# Patient Record
Sex: Male | Born: 1950 | Race: White | Hispanic: No | Marital: Married | Smoking: Former smoker
Health system: Southern US, Community
[De-identification: ages and names within clinical notes are randomized; demographics above are authoritative.]

## PROBLEM LIST (undated history)

## (undated) DIAGNOSIS — E119 Type 2 diabetes mellitus without complications: Secondary | ICD-10-CM

## (undated) DIAGNOSIS — I219 Acute myocardial infarction, unspecified: Secondary | ICD-10-CM

## (undated) DIAGNOSIS — E785 Hyperlipidemia, unspecified: Secondary | ICD-10-CM

## (undated) DIAGNOSIS — I712 Thoracic aortic aneurysm, without rupture, unspecified: Secondary | ICD-10-CM

## (undated) DIAGNOSIS — I10 Essential (primary) hypertension: Secondary | ICD-10-CM

## (undated) DIAGNOSIS — N189 Chronic kidney disease, unspecified: Secondary | ICD-10-CM

## (undated) DIAGNOSIS — I2510 Atherosclerotic heart disease of native coronary artery without angina pectoris: Secondary | ICD-10-CM

## (undated) HISTORY — DX: Essential (primary) hypertension: I10

## (undated) HISTORY — DX: Thoracic aortic aneurysm, without rupture, unspecified: I71.20

## (undated) HISTORY — DX: Atherosclerotic heart disease of native coronary artery without angina pectoris: I25.10

## (undated) HISTORY — DX: Type 2 diabetes mellitus without complications: E11.9

## (undated) HISTORY — DX: Thoracic aortic aneurysm, without rupture: I71.2

## (undated) HISTORY — DX: Acute myocardial infarction, unspecified: I21.9

## (undated) HISTORY — DX: Chronic kidney disease, unspecified: N18.9

## (undated) HISTORY — DX: Hyperlipidemia, unspecified: E78.5

---

## 2015-02-07 LAB — PULMONARY FUNCTION TEST

## 2017-08-20 ENCOUNTER — Ambulatory Visit (INDEPENDENT_AMBULATORY_CARE_PROVIDER_SITE_OTHER)
Admission: RE | Admit: 2017-08-20 | Discharge: 2017-08-20 | Disposition: A | Discharge status: Home / Self Care | Payer: No Typology Code available for payment source | Source: Ambulatory Visit | Attending: Internal Medicine | Admitting: Internal Medicine

## 2017-08-20 ENCOUNTER — Other Ambulatory Visit (INDEPENDENT_AMBULATORY_CARE_PROVIDER_SITE_OTHER): Payer: No Typology Code available for payment source

## 2017-08-20 ENCOUNTER — Encounter: Payer: Self-pay | Admitting: Internal Medicine

## 2017-08-20 ENCOUNTER — Ambulatory Visit (INDEPENDENT_AMBULATORY_CARE_PROVIDER_SITE_OTHER): Payer: No Typology Code available for payment source | Admitting: Internal Medicine

## 2017-08-20 VITALS — BP 118/68 | HR 90 | Ht 72.0 in | Wt 268.0 lb

## 2017-08-20 DIAGNOSIS — R0609 Other forms of dyspnea: Secondary | ICD-10-CM

## 2017-08-20 DIAGNOSIS — J449 Chronic obstructive pulmonary disease, unspecified: Secondary | ICD-10-CM

## 2017-08-20 DIAGNOSIS — IMO0001 Reserved for inherently not codable concepts without codable children: Secondary | ICD-10-CM

## 2017-08-20 DIAGNOSIS — R911 Solitary pulmonary nodule: Secondary | ICD-10-CM

## 2017-08-20 LAB — BASIC METABOLIC PANEL
BUN: 18 mg/dL (ref 6–23)
CO2: 28 meq/L (ref 19–32)
CREATININE: 1.4 mg/dL (ref 0.40–1.50)
Calcium: 10.2 mg/dL (ref 8.4–10.5)
Chloride: 104 mEq/L (ref 96–112)
GFR: 53.46 mL/min — ABNORMAL LOW (ref >60.00)
GLUCOSE: 154 mg/dL — AB (ref 70–99)
Potassium: 4.3 mEq/L (ref 3.5–5.1)
Sodium: 138 mEq/L (ref 135–145)

## 2017-08-20 LAB — CBC WITH DIFFERENTIAL/PLATELET
Basophils Absolute: 0.1 10*3/uL (ref 0.0–0.1)
Basophils Relative: 1.4 % (ref 0.0–3.0)
EOS ABS: 0.4 10*3/uL (ref 0.0–0.7)
Eosinophils Relative: 4.6 % (ref 0.0–5.0)
HEMATOCRIT: 41.4 % (ref 39.0–52.0)
HEMOGLOBIN: 13.8 g/dL (ref 13.0–17.0)
LYMPHS PCT: 26.3 % (ref 12.0–46.0)
Lymphs Abs: 2.2 10*3/uL (ref 0.7–4.0)
MCHC: 33.4 g/dL (ref 30.0–36.0)
MCV: 87.6 fl (ref 78.0–100.0)
Monocytes Absolute: 0.6 10*3/uL (ref 0.1–1.0)
Monocytes Relative: 6.7 % (ref 3.0–12.0)
Neutro Abs: 5 10*3/uL (ref 1.4–7.7)
Neutrophils Relative %: 61 % (ref 43.0–77.0)
Platelets: 234 10*3/uL (ref 150.0–400.0)
RBC: 4.73 Mil/uL (ref 4.22–5.81)
RDW: 15.9 % — ABNORMAL HIGH (ref 11.5–15.5)
WBC: 8.3 10*3/uL (ref 4.0–10.5)

## 2017-08-20 LAB — BRAIN NATRIURETIC PEPTIDE: Pro B Natriuretic peptide (BNP): 6 pg/mL (ref 0.0–100.0)

## 2017-08-20 LAB — SEDIMENTATION RATE: Sed Rate: 7 mm/hr (ref 0–20)

## 2017-08-20 LAB — TSH: TSH: 1.48 u[IU]/mL (ref 0.35–4.50)

## 2017-08-20 NOTE — Patient Instructions (Signed)
Please remember to go to the lab and x-ray department downstairs in the basement  for your tests - we will call you with the results when they are available.     Please schedule a follow up office visit in 6 weeks, call sooner if needed with full pfts

## 2017-08-20 NOTE — Progress Notes (Signed)
Subjective:     Patient ID: Theodore Hall, male   DOB: 11-23-1950,     MRN: [id number]  HPI  59 yowm quit smoking 1984 and sob around 2015/16 dx as CHF by Theodore Hall in [place] with CT chest 05/11/17 showing bilateral GG "nodules" plus stable 5 mm SPN vs prev study 04/25/2016  5 mm RLL    08/20/2017 1st [facility] office visit/ Theodore Hall   Chief Complaint  Patient presents with  . Pulmonary Consult    Referred by Dr. Evalee Jefferson for eval of pulmonary nodule. Pt states that he has had trouble breathing over the past several years. He has hx of pleural effusions.  He states that he gets SOB if he stands for a long time or walks more than 50 ft.    indolent onset progressive Doe x MMRC3 = can't walk 100 yards even at a slow pace at a flat grade s stopping due to sob Assoc mild dry cough intermittent day > noct and assoc sense of pnds  Sob no better with inhalers in past  Wears cpap at hs and 02 just at hs /and recliner x 30 degrees x sev years      No obvious day to day or daytime variability or assoc excess/ purulent sputum or mucus plugs or hemoptysis or cp or chest tightness, subjective wheeze or overt sinus or hb symptoms. No unusual exposure hx or h/o childhood pna/ asthma or knowledge of premature birth.  Sleeping ok on cpap/ 02  without nocturnal  or early am exacerbation  of respiratory  c/o's or need for noct saba. Also denies any obvious fluctuation of symptoms with weather or environmental changes or other aggravating or alleviating factors except as outlined above   Current Allergies, Complete Past Medical History, Past Surgical History, Family History, and Social History were reviewed in [facility] record.  ROS  The following are not active complaints unless bolded Hoarseness, sore throat, dysphagia, dental problems, itching, sneezing,  nasal congestion or discharge of excess mucus or purulent secretions, ear ache,   fever, chills, sweats, unintended wt loss or wt gain,  classically pleuritic or exertional cp,  orthopnea pnd or leg swelling, presyncope, palpitations, abdominal pain, anorexia, nausea, vomiting, diarrhea  or change in bowel habits or change in bladder habits, change in stools or change in urine, dysuria, hematuria,  rash, arthralgias, visual complaints, headache, numbness, weakness or ataxia or problems with walking or coordination,  change in mood/affect or memory.        Current Meds  Medication Sig  . aspirin 325 MG tablet Take 325 mg by mouth daily.  Marland Kitchen atorvastatin (LIPITOR) 80 MG tablet Take 80 mg by mouth daily.  . carvedilol (COREG) 25 MG tablet Take 12.5 mg by mouth 2 (two) times daily with a meal.  . DULoxetine (CYMBALTA) 60 MG capsule Take 60 mg by mouth daily.  . empagliflozin (JARDIANCE) 25 MG TABS tablet Take 25 mg by mouth daily.  . furosemide (LASIX) 40 MG tablet Take 40 mg by mouth daily.  . hydrALAZINE (APRESOLINE) 50 MG tablet Take 50 mg by mouth 3 (three) times daily.  . isosorbide dinitrate (ISORDIL) 20 MG tablet Take 20 mg by mouth 3 (three) times daily.  Marland Kitchen losartan (COZAAR) 100 MG tablet Take 100 mg by mouth daily.  . metFORMIN (GLUCOPHAGE) 500 MG tablet Take 500 mg by mouth 2 (two) times daily with a meal.  . nystatin cream (MYCOSTATIN) Apply 1 application topically 2 (two) times daily.  Marland Kitchen  OXYGEN 2lpm with CPAP at night  . QUEtiapine (SEROQUEL) 300 MG tablet Take 300 mg by mouth at bedtime.  Marland Kitchen. spironolactone (ALDACTONE) 25 MG tablet Take 25 mg by mouth daily.  . traZODone (DESYREL) 50 MG tablet Take 50 mg by mouth at bedtime as needed for sleep.  Marland Kitchen. UNABLE TO FIND Med Name: CPAP with o2 2lpm      Review of Systems     Objective:   Physical Exam    amb obese wm using rollator   Wt Readings from Last 3 Encounters:  08/20/17 268 lb (121.6 kg)     Vital signs reviewed - Note on arrival 02 sats  94% on RA     HEENT: nl dentition, turbinates bilaterally, and oropharynx. Nl external ear canals without cough  reflex   NECK :  without JVD/Nodes/TM/ nl carotid upstrokes bilaterally   LUNGS: no acc muscle use,  Nl contour chest which is clear to A and P bilaterally without cough on insp or exp maneuvers   CV:  RRR  no s3 or murmur or increase in P2, and no edema   ABD:  Quite obese but soft and nontender with limited inspiratory excursion in the supine position. No bruits or organomegaly appreciated, bowel sounds nl  MS:  Nl gait/ ext warm without deformities, calf tenderness, cyanosis or clubbing No obvious joint restrictions   SKIN: warm and dry without lesions    NEURO:  alert, approp, nl sensorium with  no motor or cerebellar deficits apparent.      CXR PA and Lateral:   08/20/2017 :    I personally reviewed images and agree with radiology impression as follows:    1. No pneumonia or effusion. 2. Slight hyper aeration.  Borderline cardiomegaly.   Labs ordered/ reviewed:      Chemistry      Component Value Date/Time   NA 138 08/20/2017 1533   K 4.3 08/20/2017 1533   CL 104 08/20/2017 1533   CO2 28 08/20/2017 1533   BUN 18 08/20/2017 1533   CREATININE 1.40 08/20/2017 1533      Component Value Date/Time   CALCIUM 10.2 08/20/2017 1533        Lab Results  Component Value Date   WBC 8.3 08/20/2017   HGB 13.8 08/20/2017   HCT 41.4 08/20/2017   MCV 87.6 08/20/2017   PLT 234.0 08/20/2017       EOS                                                               0.4                                    08/20/2017      Lab Results  Component Value Date   TSH 1.48 08/20/2017     Lab Results  Component Value Date   PROBNP 6.0 08/20/2017       Lab Results  Component Value Date   ESRSEDRATE 7 08/20/2017            Assessment:

## 2017-08-21 ENCOUNTER — Encounter: Payer: Self-pay | Admitting: Internal Medicine

## 2017-08-21 DIAGNOSIS — IMO0001 Reserved for inherently not codable concepts without codable children: Secondary | ICD-10-CM | POA: Insufficient documentation

## 2017-08-21 DIAGNOSIS — R911 Solitary pulmonary nodule: Secondary | ICD-10-CM | POA: Insufficient documentation

## 2017-08-21 NOTE — Progress Notes (Signed)
Attempted to call patient but number rang busy. No message left will try again tomorrow

## 2017-08-21 NOTE — Assessment & Plan Note (Signed)
Spirometry  01/2015  FEV1 1.95 (62%)  Ratio 71 p 2 % improvement p saba   - Spirometry 08/20/2017  FEV1 2.45 (68%)  Ratio 78  s curvature     When respiratory symptoms begin or become refractory well after a patient reports complete smoking cessation,  Especially when this wasn't the case while they were smoking, a red flag is raised based on the work of Dr Primitivo GauzeFletcher which states:  if you quit smoking when your best day FEV1 is still well preserved it is highly unlikely you will progress to severe disease.  That is to say, once the smoking stops,  the symptoms should not suddenly erupt or markedly worsen.  If so, the differential diagnosis should include  obesity/deconditioning,  LPR/Reflux/Aspiration syndromes,  occult CHF, or  especially side effect of medications commonly used in this population.   bnp is very low so most likely this is obesity/ deconditioning  rec return for full pfts to complete the w/u    Total time devoted to counseling  > 50 % of initial 60 min office visit:  review case with pt/ discussion of options/alternatives/ personally creating written customized instructions  in presence of pt  then going over those specific  Instructions directly with the pt including how to use all of the meds but in particular covering each new medication in detail and the difference between the maintenance= "automatic" meds and the prns using an action plan format for the latter (If this problem/symptom => do that organization reading Left to right).  Please see AVS from this visit for a full list of these instructions which I personally wrote for this pt and  are unique to this visit.

## 2017-08-21 NOTE — Assessment & Plan Note (Addendum)
08/20/2017   Walked RA x one lap @ 185 stopped due to  Sob/ nl pace, no desat    Symptoms are   disproportionate to objective findings and not clear to what extent this is actually a pulmonary  problem but pt does appear to have difficult to sort out respiratory symptoms of unknown origin for which  DDX  = almost all start with A and  include Adherence, Ace Inhibitors, Acid Reflux, Active Sinus Disease, Alpha 1 Antitripsin deficiency, Anxiety masquerading as Airways dz,  ABPA,  Allergy(esp in young), Aspiration (esp in elderly), Adverse effects of meds,  Active smokers, A bunch of PE's/clot burden (a few small clots can't cause this syndrome unless there is already severe underlying pulm or vascular dz with poor reserve),  Anemia or thyroid disorder, plus two Bs  = Bronchiectasis and Beta blocker use..and one C= CHF    Adherence is always the initial "prime suspect" and is a multilayered concern that requires a "trust but verify" approach in every patient - starting with knowing how to use medications, especially inhalers, correctly, keeping up with refills and understanding the fundamental difference between maintenance and prns vs those medications only taken for a very short course and then stopped and not refilled.  - advised to return with all meds in hand using a trust but verify approach to confirm accurate Medication  Reconciliation The principal here is that until we are certain that the  patients are doing what we've asked, it makes no sense to ask them to do more.   ? Acid (or non-acid) GERD > always difficult to exclude as up to 75% of pts in some series report no assoc GI/ Heartburn symptoms> rec continue max (24h)  acid suppression and diet restrictions/ reviewed     ? Allergy /asthma > no clinical evidence   ? Anxiety /depression/ deconditioning> usually at the bottom of this list of usual suspects but should be   higher on this pt's based on H and P and note already on psychotropics .    ? Bb Effects >  In the setting of respiratory symptoms of unknown etiology,  It would be preferable to use bystolic, the most beta -1  selective Beta blocker available in sample form, with bisoprolol the most selective generic choice  on the market, at least on a trial basis, to make sure the spillover Beta 2 effects of the less specific Beta blockers are not contributing to this patient's symptoms.   ? chf > ruled out with bnp only 6 today

## 2017-08-21 NOTE — Progress Notes (Signed)
Attempted to call patient phone rang busy, will try again tomrorow

## 2017-08-21 NOTE — Assessment & Plan Note (Signed)
CT chest 05/05/16 vs 05/11/17  Stable 5 mm RLL and new GG changes both lower lobes - Cxr 08/20/2017 wnl   Although there are clearly abnormalities on CT scan, they should probably be considered "microscopic" since not obvious on plain cxr .     In the setting of obvious "macroscopic" health issues,  I am very reluctatnt to embark on an invasive w/u at this point but will arrange consevative  follow up and in the meantime see what we can do to address the patient's subjective concerns.     Most likely the GG changes are benign and either related to infection or chf and f/u can be done thru [facility] but really optional here.

## 2017-08-24 NOTE — Progress Notes (Signed)
Unable to reach the pt at his preferred number

## 2017-08-24 NOTE — Progress Notes (Signed)
Unable to reach pt at his preferred number

## 2017-08-26 ENCOUNTER — Encounter: Payer: No Typology Code available for payment source | Admitting: Cardiothoracic Surgery

## 2017-09-08 ENCOUNTER — Other Ambulatory Visit: Payer: Self-pay

## 2017-09-08 ENCOUNTER — Institutional Professional Consult (permissible substitution) (INDEPENDENT_AMBULATORY_CARE_PROVIDER_SITE_OTHER): Payer: No Typology Code available for payment source | Admitting: Cardiothoracic Surgery

## 2017-09-08 ENCOUNTER — Encounter: Payer: Self-pay | Admitting: Cardiothoracic Surgery

## 2017-09-08 VITALS — BP 121/68 | HR 80 | Resp 18 | Ht 72.0 in | Wt 266.4 lb

## 2017-09-08 DIAGNOSIS — I712 Thoracic aortic aneurysm, without rupture, unspecified: Secondary | ICD-10-CM

## 2017-09-08 NOTE — Progress Notes (Signed)
[facility]P [facility] Referring Provider is Ninfa Meeker, MD  Chief Complaint  Patient presents with  . 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  From records available it was first noted in 2017 at which time the diameter was 4.6 cm.  There is no family history of aortic dissection or abdominal aortic aneurysm disease.  Patient has hypertension and is compliant with his medications including losartan and carvedilol.  Patient had echocardiogram in 2018 which showed EF of 55% with a trileaflet aortic valve without aortic insufficiency.  The patient had a cardiac cath within the past 4 years in [place] and had calcified three-vessel disease and apparently he was not felt to be candidate for interventional therapy and has been treated medically.  He denies much chest pain and takes nitroglycerin very infrequently for pain radiating down his left arm.  Past Medical History:  Diagnosis Date  . CAD (coronary artery disease)   . CKD (chronic kidney disease)   . DM (diabetes mellitus) (HCC)   . HTN (hypertension)   . Hyperlipidemia   . MI (myocardial infarction) (HCC)   . Thoracic aortic aneurysm without rupture (HCC)     History reviewed. No pertinent surgical history.  History reviewed. No pertinent family history.  Social History Social History   Tobacco Use  . Smoking status: Former Smoker    Packs/day: 4.00    Years: 17.00    Pack years: 68.00    Types: Cigarettes    Last attempt to quit: 05/26/1982    Years since quitting: 35.3  . Smokeless tobacco: Never Used  Substance Use Topics  . Alcohol use: Not  on file  . Drug use: Not on file    Current Outpatient Medications  Medication Sig Dispense Refill  . aspirin 325 MG tablet Take 325 mg by mouth daily.    Marland Kitchen atorvastatin (LIPITOR) 80 MG tablet Take 80 mg by mouth daily.    . carboxymethylcellulose (REFRESH PLUS) 0.5 % SOLN 1 drop 3 (three) times daily as needed.    . carvedilol (COREG) 25 MG tablet Take 12.5 mg by mouth 2 (two) times daily with a meal.    . cyproheptadine (PERIACTIN) 4 MG tablet Take 4 mg by mouth 3 (three) times daily as needed for allergies.    . DULoxetine (CYMBALTA) 60 MG capsule Take 60 mg by mouth daily.    . empagliflozin (JARDIANCE) 25 MG TABS tablet Take 25 mg by mouth daily.    . furosemide (LASIX) 40 MG tablet Take 40 mg by mouth daily.    Marland Kitchen glipiZIDE (GLUCOTROL) 5 MG tablet Take 5 mg by mouth daily before breakfast.    . hydrALAZINE (APRESOLINE) 50 MG tablet Take 50 mg by mouth 3 (three) times daily.    . isosorbide dinitrate (ISORDIL) 20 MG tablet Take 20 mg by mouth 3 (three) times daily.    Marland Kitchen losartan (COZAAR) 100 MG tablet Take 100 mg by mouth daily.    Marland Kitchen MAGNESIUM PO Take 1 tablet by mouth daily.    . metFORMIN (GLUCOPHAGE) 500 MG tablet Take 500 mg by mouth 2 (two) times daily with a meal.    .  nitroGLYCERIN (NITROSTAT) 0.4 MG SL tablet Place 0.4 mg under the tongue every 5 (five) minutes as needed for chest pain.    Marland Kitchen nystatin cream (MYCOSTATIN) Apply 1 application topically 2 (two) times daily.    Marland Kitchen oxybutynin (DITROPAN-XL) 10 MG 24 hr tablet Take 10 mg by mouth 2 (two) times daily.    . OXYGEN 2lpm with CPAP at night    . potassium chloride (KLOR-CON) 20 MEQ packet Take 40 mEq by mouth 3 (three) times daily.    . QUEtiapine (SEROQUEL) 300 MG tablet Take 300 mg by mouth at bedtime.    Marland Kitchen spironolactone (ALDACTONE) 25 MG tablet Take 25 mg by mouth daily.    Marland Kitchen terazosin (HYTRIN) 5 MG capsule Take 5 mg by mouth 3 (three) times daily.    . traZODone (DESYREL) 50 MG tablet Take 50 mg by mouth at bedtime as  needed for sleep.    Marland Kitchen UNABLE TO FIND Med Name: CPAP with o2 2lpm     No current facility-administered medications for this visit.     Allergies  Allergen Reactions  . Dilaudid [Hydromorphone Hcl] Other (See Comments)    Agitation and hallucinations  . Lisinopril Other (See Comments)    Unknown, ask patient  . Morphine Rash    Causes hallucination and agitation      Review of Systems  Weight stable No syncope or falls Lives with family Monitors his diabetes No history of thoracic trauma No symptoms of heart failure including orthopnea PND or edema No difficulty swallowing or dental complaints No abdominal pain jaundice or blood per rectum  BP 121/68 (BP Location: Right Arm, Patient Position: Sitting, Cuff Size: Large)   Pulse 80   Resp 18   Ht 6' (1.829 m)   Wt 266 lb 6.4 oz (120.8 kg)   SpO2 97% Comment: RA  BMI 36.13 kg/m  Physical Exam       Exam    General- alert and comfortable, obese in no distress    Neck- no JVD, no cervical adenopathy palpable, no carotid bruit   Lungs- clear without rales, wheezes.  No chest deformity   Cor- regular rate and rhythm, no murmur , gallop   Abdomen- soft, non-tender, obese without pulsatile mass   Extremities - warm, non-tender, minimal edema   Neuro- oriented, appropriate, no focal weakness   Diagnostic Tests: CTA performed in March 2019 no pleural effusion.  Shows a smooth fusiform ascending aneurysm measuring 4.6 cm diameter.  There is mild calcification of the distal arch.  The descending thoracic aorta is not significantly enlarged.  It does not appear the thoracic aneurysm has changed since 2017.  Impression: Patient has a stable asymptomatic fusiform ascending aneurysm. It remains 4.6 cm in diameter for at least 2 years and probably longer Thoracic aneurysms are not recommended for surgical repair until the diameter reaches 5.5 cm at which point the risk of aortic dissection becomes significant and approaches the  risk of surgery.  The patient's best therapy at this point his blood pressure control and surveillance scans of his aorta with annual CT scans.  I will see the patient back in December 2019 with a CTA to assess the thoracic aortic fusiform aneurysm, one year since his previous scan.  He understands importance of being compliant with his blood pressure medication, he should not lift heavy weights or 50 pounds, and he should avoid Cipro floxacillin and fluoroquinolone antibiotics which can weaken the wall of the aorta.  Plan: Return December 2019  with CTA of thoracic aorta.  Continue current blood pressure medications and follow precautions as noted above   Mikey BussingPeter Van Trigt III, MD [facility] [phone]

## 2017-09-21 ENCOUNTER — Encounter: Payer: Self-pay | Admitting: *Deleted

## 2017-10-01 ENCOUNTER — Ambulatory Visit (INDEPENDENT_AMBULATORY_CARE_PROVIDER_SITE_OTHER): Payer: No Typology Code available for payment source | Admitting: Internal Medicine

## 2017-10-01 ENCOUNTER — Encounter: Payer: Self-pay | Admitting: Internal Medicine

## 2017-10-01 ENCOUNTER — Other Ambulatory Visit (INDEPENDENT_AMBULATORY_CARE_PROVIDER_SITE_OTHER): Payer: No Typology Code available for payment source

## 2017-10-01 VITALS — BP 104/64 | HR 86 | Ht 72.0 in | Wt 266.0 lb

## 2017-10-01 DIAGNOSIS — R053 Chronic cough: Secondary | ICD-10-CM

## 2017-10-01 DIAGNOSIS — IMO0001 Reserved for inherently not codable concepts without codable children: Secondary | ICD-10-CM

## 2017-10-01 DIAGNOSIS — R0609 Other forms of dyspnea: Secondary | ICD-10-CM | POA: Diagnosis not present

## 2017-10-01 DIAGNOSIS — J449 Chronic obstructive pulmonary disease, unspecified: Secondary | ICD-10-CM

## 2017-10-01 DIAGNOSIS — R911 Solitary pulmonary nodule: Secondary | ICD-10-CM | POA: Diagnosis not present

## 2017-10-01 DIAGNOSIS — R05 Cough: Secondary | ICD-10-CM

## 2017-10-01 LAB — PULMONARY FUNCTION TEST
DL/VA % pred: 73 %
DL/VA: 3.48 ml/min/mmHg/L
DLCO COR % PRED: 59 %
DLCO COR: 20.98 ml/min/mmHg
DLCO unc % pred: 58 %
DLCO unc: 20.49 ml/min/mmHg
FEF 25-75 Post: 2.11 L/sec
FEF 25-75 Pre: 2.55 L/sec
FEF2575-%Change-Post: -17 %
FEF2575-%PRED-PRE: 93 %
FEF2575-%Pred-Post: 77 %
FEV1-%CHANGE-POST: -3 %
FEV1-%PRED-POST: 80 %
FEV1-%Pred-Pre: 83 %
FEV1-Post: 2.87 L
FEV1-Pre: 2.98 L
FEV1FVC-%Change-Post: 0 %
FEV1FVC-%Pred-Pre: 104 %
FEV6-%Change-Post: -4 %
FEV6-%Pred-Post: 80 %
FEV6-%Pred-Pre: 84 %
FEV6-PRE: 3.85 L
FEV6-Post: 3.69 L
FEV6FVC-%CHANGE-POST: -1 %
FEV6FVC-%PRED-POST: 103 %
FEV6FVC-%Pred-Pre: 104 %
FVC-%Change-Post: -3 %
FVC-%PRED-POST: 78 %
FVC-%Pred-Pre: 80 %
FVC-Post: 3.76 L
FVC-Pre: 3.88 L
POST FEV6/FVC RATIO: 98 %
Post FEV1/FVC ratio: 76 %
Pre FEV1/FVC ratio: 77 %
Pre FEV6/FVC Ratio: 100 %

## 2017-10-01 LAB — CBC WITH DIFFERENTIAL/PLATELET
BASOS ABS: 0.1 10*3/uL (ref 0.0–0.1)
Basophils Relative: 1.1 % (ref 0.0–3.0)
Eosinophils Absolute: 0.6 10*3/uL (ref 0.0–0.7)
Eosinophils Relative: 6.6 % — ABNORMAL HIGH (ref 0.0–5.0)
HEMATOCRIT: 41.6 % (ref 39.0–52.0)
Hemoglobin: 13.8 g/dL (ref 13.0–17.0)
LYMPHS PCT: 22.8 % (ref 12.0–46.0)
Lymphs Abs: 2 10*3/uL (ref 0.7–4.0)
MCHC: 33.3 g/dL (ref 30.0–36.0)
MCV: 88.6 fl (ref 78.0–100.0)
MONOS PCT: 7.9 % (ref 3.0–12.0)
Monocytes Absolute: 0.7 10*3/uL (ref 0.1–1.0)
NEUTROS ABS: 5.3 10*3/uL (ref 1.4–7.7)
Neutrophils Relative %: 61.6 % (ref 43.0–77.0)
Platelets: 225 10*3/uL (ref 150.0–400.0)
RBC: 4.7 Mil/uL (ref 4.22–5.81)
RDW: 16.1 % — AB (ref 11.5–15.5)
WBC: 8.6 10*3/uL (ref 4.0–10.5)

## 2017-10-01 NOTE — Addendum Note (Signed)
Addended by: Jaynee Eagles C on: 10/01/2017 11:18 AM   Modules accepted: Orders

## 2017-10-01 NOTE — Progress Notes (Signed)
Subjective:     Patient ID: Theodore Hall, male   DOB: [birth year],     MRN: [id number]    Brief patient profile:  50 yowm quit smoking 1984 and sob around 2015/16 dx as CHF by [place] with CT chest 05/11/17 showing bilateral GG "nodules" plus stable 5 mm SPN vs prev study 04/25/2016  5 mm RLL    History of Present Illness  08/20/2017 1st [facility] office visit/ [facility]   Chief Complaint  Patient presents with  . Pulmonary Consult    Referred by Dr. Evalee Jefferson for eval of pulmonary nodule. Pt states that he has had trouble breathing over the past several years. He has hx of pleural effusions.  He states that he gets SOB if he stands for a long time or walks more than 50 ft.    indolent onset progressive Doe x MMRC3 = can't walk 100 yards even at a slow pace at a flat grade s stopping due to sob Assoc mild dry cough intermittent day > noct and assoc sense of pnds  Sob no better with inhalers in past  Wears cpap at hs and 02 just at hs /and recliner x 30 degrees x sev years    rec No change rx     10/01/2017  f/u ov/[facility] re: obeisty / cough  Chief Complaint  Patient presents with  . COPD   Dyspnea:  MMRC3 = can't walk 100 yards even at a slow pace at a flat grade s stopping due to sob and knees give out about the same time  Cough: dry cough worse with pollen Sleep: cpap/ 02 ok  At 30 degrees hob  SABA use:  None   No obvious day to day or daytime variability or assoc excess/ purulent sputum or mucus plugs or hemoptysis or cp or chest tightness, subjective wheeze or overt sinus or hb symptoms. No unusual exposure hx or h/o childhood pna/ asthma or knowledge of premature birth.  Sleeping  Cpap/ 02 ok at 30 degrees hob  without nocturnal  or early am exacerbation  of respiratory  c/o's or need for noct saba. Also denies any obvious fluctuation of symptoms with weather or environmental changes or other aggravating or alleviating factors except as outlined above   Current Allergies,  Complete Past Medical History, Past Surgical History, Family History, and Social History were reviewed in [facility] record.  ROS  The following are not active complaints unless bolded Hoarseness, sore throat, dysphagia, dental problems, itching, sneezing,  nasal congestion or discharge of excess mucus or purulent secretions, ear ache,   fever, chills, sweats, unintended wt loss or wt gain, classically pleuritic or exertional cp,  orthopnea pnd or arm/hand swelling  or leg swelling, presyncope, palpitations, abdominal pain, anorexia, nausea, vomiting, diarrhea  or change in bowel habits or change in bladder habits, change in stools or change in urine, dysuria, hematuria,  rash, arthralgias, visual complaints, headache, numbness, weakness or ataxia or problems with walking or coordination,  change in mood or  memory.        Current Meds  Medication Sig  . aspirin 325 MG tablet Take 325 mg by mouth daily.  Marland Kitchen atorvastatin (LIPITOR) 80 MG tablet Take 80 mg by mouth daily.  . carboxymethylcellulose (REFRESH PLUS) 0.5 % SOLN 1 drop 3 (three) times daily as needed.  . carvedilol (COREG) 25 MG tablet Take 12.5 mg by mouth 2 (two) times daily with a meal.  . cyproheptadine (PERIACTIN) 4 MG  tablet Take 4 mg by mouth 3 (three) times daily as needed (for nightmares).  . DULoxetine (CYMBALTA) 60 MG capsule Take 60 mg by mouth daily.  . furosemide (LASIX) 40 MG tablet Take 40 mg by mouth daily.  Marland Kitchen glipiZIDE (GLUCOTROL) 5 MG tablet Take 5 mg by mouth daily before breakfast.  . hydrALAZINE (APRESOLINE) 50 MG tablet Take 50 mg by mouth 3 (three) times daily.  . isosorbide dinitrate (ISORDIL) 20 MG tablet Take 20 mg by mouth 3 (three) times daily.  Marland Kitchen losartan (COZAAR) 100 MG tablet Take 100 mg by mouth daily.  Marland Kitchen MAGNESIUM PO Take 1 tablet by mouth daily.  . metFORMIN (GLUCOPHAGE) 500 MG tablet Take 500 mg by mouth 2 (two) times daily with a meal.  . nitroGLYCERIN (NITROSTAT) 0.4 MG SL  tablet Place 0.4 mg under the tongue every 5 (five) minutes as needed for chest pain.  Marland Kitchen nystatin cream (MYCOSTATIN) Apply 1 application topically 2 (two) times daily.  Marland Kitchen oxybutynin (DITROPAN-XL) 10 MG 24 hr tablet Take 10 mg by mouth 2 (two) times daily.  . OXYGEN 2lpm with CPAP at night  . potassium chloride (KLOR-CON) 20 MEQ packet Take 40 mEq by mouth 3 (three) times daily.  . QUEtiapine (SEROQUEL) 300 MG tablet Take 300 mg by mouth at bedtime.  Marland Kitchen spironolactone (ALDACTONE) 25 MG tablet Take 25 mg by mouth daily.  Marland Kitchen terazosin (HYTRIN) 5 MG capsule Take 5 mg by mouth 3 (three) times daily.  . traZODone (DESYREL) 50 MG tablet Take 50 mg by mouth at bedtime as needed for sleep.  Marland Kitchen UNABLE TO FIND Med Name: CPAP with o2 2lpm               Objective:   Physical Exam  amb obese wm using cane     Wt Readings from Last 3 Encounters:  10/01/17 266 lb (120.7 kg)  09/08/17 266 lb 6.4 oz (120.8 kg)  08/20/17 268 lb (121.6 kg)     Vital signs reviewed - Note on arrival 02 sats  97% on RA       HEENT: nl dentition, turbinates bilaterally, and oropharynx. Nl external ear canals without cough reflex   NECK :  without JVD/Nodes/TM/ nl carotid upstrokes bilaterally   LUNGS: no acc muscle use,  Nl contour chest which is clear to A and P bilaterally without cough on insp or exp maneuvers   CV:  RRR  no s3 or murmur or increase in P2, and no edema   ABD:  Quite obese/ soft with limited inspiratory excursion in the supine position. No bruits or organomegaly appreciated, bowel sounds nl  MS:  Nl gait/ ext warm without deformities, calf tenderness, cyanosis or clubbing No obvious joint restrictions   SKIN: warm and dry without lesions    NEURO:  alert, approp, nl sensorium with  no motor or cerebellar deficits apparent.         Labs ordered 10/01/2017   Allergy profile       Assessment:

## 2017-10-01 NOTE — Progress Notes (Signed)
PFT completed today.  

## 2017-10-01 NOTE — Patient Instructions (Addendum)
Your problem with your breathing is your weight and your joints   I will call you in 3 months to make sure you've [facility]  Please remember to go to the lab department downstairs in the basement  for your tests - we will call you with the results when they are available.  Pulmonary follow up is as needed

## 2017-10-02 ENCOUNTER — Encounter: Payer: Self-pay | Admitting: Internal Medicine

## 2017-10-02 DIAGNOSIS — R05 Cough: Secondary | ICD-10-CM | POA: Insufficient documentation

## 2017-10-02 DIAGNOSIS — R053 Chronic cough: Secondary | ICD-10-CM | POA: Insufficient documentation

## 2017-10-02 LAB — RESPIRATORY ALLERGY PROFILE REGION II ~~LOC~~
Allergen, A. alternata, m6: <0.1 kU/L
Allergen, Cedar tree, t12: <0.1 kU/L
Allergen, Comm Silver Birch, t9: <0.1 kU/L
Allergen, Cottonwood, t14: <0.1 kU/L
Allergen, Mulberry, t76: <0.1 kU/L
Aspergillus fumigatus, m3: <0.1 kU/L
Bermuda Grass: <0.1 kU/L
Box Elder IgE: <0.1 kU/L
CLADOSPORIUM HERBARUM (M2) IGE: <0.1 kU/L
CLASS: 0
CLASS: 0
CLASS: 0
CLASS: 0
CLASS: 0
CLASS: 0
CLASS: 0
CLASS: 0
CLASS: 0
CLASS: 0
CLASS: 0
COMMON RAGWEED (SHORT) (W1) IGE: <0.1 kU/L
Cat Dander: <0.1 kU/L
Class: 0
Class: 0
Class: 0
Class: 0
Class: 0
Class: 0
Class: 0
Class: 0
Class: 0
Class: 0
Class: 0
Class: 0
Class: 0
D. farinae: <0.1 kU/L
Dog Dander: <0.1 kU/L
IgE (Immunoglobulin E), Serum: 22 kU/L (ref <=114)
Pecan/Hickory Tree IgE: <0.1 kU/L
Rough Pigweed  IgE: <0.1 kU/L
Sheep Sorrel IgE: <0.1 kU/L

## 2017-10-02 LAB — INTERPRETATION:

## 2017-10-02 NOTE — Assessment & Plan Note (Signed)
-   Allergy profile 10/01/2017 >  Eos 0.6 /  IgE  Pending   May have seasonal rhinitis > rec try otc's first and f/u here prn

## 2017-10-02 NOTE — Assessment & Plan Note (Signed)
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

## 2017-10-02 NOTE — Assessment & Plan Note (Signed)
Quit smoking in 1994 Spirometry  01/2015  FEV1 1.95 (62%)  Ratio 71 p 2 % improvement p saba   - Spirometry 08/20/2017  FEV1 2.45 (68%)  Ratio 78  s curvature  - PFT's  10/01/2017  FEV1 2.98 (83 % ) ratio 77  p no % improvement from saba p nothing prior to study with DLCO  58/59c  % corrects to 73 % for alv volume  /very mild curvature     When respiratory symptoms begin or become refractory well after a patient reports complete smoking cessation,  Especially when this wasn't the case while they were smoking, a red flag is raised based on the work of Dr Primitivo Gauze which states:  if you quit smoking when your best day FEV1 is still well preserved it is highly unlikely you will progress to severe disease.  That is to say, once the smoking stops,  the symptoms should not suddenly erupt or markedly worsen.  If so, the differential diagnosis should include  obesity/deconditioning,  LPR/Reflux/Aspiration syndromes,  occult CHF, or  especially side effect of medications commonly used in this population (esp acei and non-specific Beta blockers in pts like this)   He has no significant copd at this point nor active asthma though is at risk > no resp rx needed for now and  f/u can be prn  I had an extended discussion with the patient reviewing all relevant studies completed to date and  lasting 15 to 20 minutes of a 25 minute final summary office visit    Each maintenance medication was reviewed in detail including most importantly the difference between maintenance and prns and under what circumstances the prns are to be triggered using an action plan format that is not reflected in the computer generated alphabetically organized AVS.    Please see AVS for specific instructions unique to this visit that I personally wrote and verbalized to the the pt in detail and then reviewed with pt  by my nurse highlighting any  changes in therapy recommended at today's visit to their plan of care.

## 2017-10-02 NOTE — Assessment & Plan Note (Signed)
No evidence of copd/ strongly suspect obesity and deconditioning plus component of chf > [facility]

## 2017-10-05 NOTE — Progress Notes (Signed)
ATC, Line busy

## 2018-01-05 ENCOUNTER — Encounter: Payer: Self-pay | Admitting: *Deleted

## 2018-01-05 ENCOUNTER — Telehealth: Payer: Self-pay | Admitting: *Deleted

## 2018-01-05 NOTE — Telephone Encounter (Signed)
Unable to reach by telephone so mailed letter

## 2018-01-05 NOTE — Telephone Encounter (Signed)
-----   Message from Nyoka CowdenMichael B Wert, MD sent at 10/01/2017 11:58 AM EDT ----- [facility] now

## 2018-04-05 ENCOUNTER — Other Ambulatory Visit: Payer: Self-pay | Admitting: Cardiothoracic Surgery

## 2018-04-05 DIAGNOSIS — I712 Thoracic aortic aneurysm, without rupture, unspecified: Secondary | ICD-10-CM

## 2018-05-12 ENCOUNTER — Ambulatory Visit: Payer: Self-pay | Admitting: Cardiothoracic Surgery

## 2018-05-12 ENCOUNTER — Other Ambulatory Visit: Payer: Self-pay

## 2018-06-23 ENCOUNTER — Other Ambulatory Visit: Payer: Self-pay

## 2018-06-23 ENCOUNTER — Ambulatory Visit: Payer: Self-pay | Admitting: Cardiothoracic Surgery

## 2018-07-21 ENCOUNTER — Ambulatory Visit (INDEPENDENT_AMBULATORY_CARE_PROVIDER_SITE_OTHER): Payer: No Typology Code available for payment source | Admitting: Cardiothoracic Surgery

## 2018-07-21 ENCOUNTER — Ambulatory Visit
Admission: RE | Admit: 2018-07-21 | Discharge: 2018-07-21 | Disposition: A | Discharge status: Home / Self Care | Payer: No Typology Code available for payment source | Source: Ambulatory Visit | Attending: Cardiothoracic Surgery | Admitting: Cardiothoracic Surgery

## 2018-07-21 ENCOUNTER — Encounter: Payer: Self-pay | Admitting: Cardiothoracic Surgery

## 2018-07-21 ENCOUNTER — Other Ambulatory Visit: Payer: Self-pay

## 2018-07-21 VITALS — BP 116/68 | HR 85 | Resp 18 | Ht 72.0 in | Wt 261.0 lb

## 2018-07-21 DIAGNOSIS — I712 Thoracic aortic aneurysm, without rupture, unspecified: Secondary | ICD-10-CM

## 2018-07-21 MED ORDER — IOPAMIDOL (ISOVUE-370) INJECTION 76%
75.0000 mL | Freq: Once | INTRAVENOUS | Status: AC | PRN
  Administered 2018-07-21: 75 mL via INTRAVENOUS

## 2018-07-21 NOTE — Progress Notes (Signed)
[facility] Referring Provider is Ninfa Meeker, MD  Chief Complaint  Patient presents with  . TAA    8 month f/u eiyh CTA CHEST today  Patient examined, images of CTA personally reviewed and discussed with patient and daughter.  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  This was first noted in 2017 at which time it was 4.6 cm.  The patient has a past history of smoking.  He has hypertension but is compliant with his medications including carvedilol, losartan, a Apresoline.  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  Patient continues to be asymptomatic without significant chest pain.  His only hospitalizations since his last visit was for a urologic procedure at the [facility] in for altered mental status due to his psychotropic medication which was adjusted [Seroquel].  Review of his medical record shows his blood pressure to be well controlled on his current medications. Past Medical History:  Diagnosis Date  . CAD (coronary artery disease)   . CKD (chronic kidney disease)   . DM (diabetes mellitus) (HCC)   . HTN (hypertension)   . Hyperlipidemia   . MI (myocardial infarction) (HCC)   . Thoracic aortic aneurysm without rupture (HCC)     History reviewed. No pertinent surgical history.  History reviewed. No pertinent family history.  Social History Social History   Tobacco Use  . Smoking status: Former Smoker    Packs/day: 4.00    Years: 17.00    Pack years: 68.00    Types: Cigarettes    Last attempt to quit: 05/26/1982    Years since quitting: 36.1  . Smokeless tobacco: Never Used  Substance Use Topics  . Alcohol use: Not on file  . Drug use: Not on file    Current Outpatient Medications  Medication Sig Dispense Refill  . aspirin 325 MG tablet Take 325 mg by mouth daily.    Marland Kitchen atorvastatin (LIPITOR) 80 MG tablet Take 80 mg by mouth daily.    . carboxymethylcellulose (REFRESH  PLUS) 0.5 % SOLN 1 drop 3 (three) times daily as needed.    . carvedilol (COREG) 25 MG tablet Take 12.5 mg by mouth 2 (two) times daily with a meal.    . cyproheptadine (PERIACTIN) 4 MG tablet Take 4 mg by mouth 3 (three) times daily as needed (for nightmares).    . DULoxetine (CYMBALTA) 60 MG capsule Take 60 mg by mouth daily.    . furosemide (LASIX) 40 MG tablet Take 40 mg by mouth daily.    Marland Kitchen glipiZIDE (GLUCOTROL) 5 MG tablet Take 5 mg by mouth daily before breakfast.    . hydrALAZINE (APRESOLINE) 50 MG tablet Take 50 mg by mouth 3 (three) times daily.    . isosorbide dinitrate (ISORDIL) 20 MG tablet Take 20 mg by mouth 3 (three) times daily.    Marland Kitchen losartan (COZAAR) 100 MG tablet Take 100 mg by mouth daily.    Marland Kitchen MAGNESIUM PO Take 1 tablet by mouth daily.    . metFORMIN (GLUCOPHAGE) 500 MG tablet Take 500 mg by mouth 2 (two) times daily with a meal.    . nitroGLYCERIN (NITROSTAT) 0.4 MG SL tablet Place 0.4 mg under the tongue every 5 (five) minutes as needed for chest pain.    Marland Kitchen nystatin cream (MYCOSTATIN) Apply 1 application topically 2 (two) times daily.    Marland Kitchen oxybutynin (DITROPAN-XL) 10 MG 24 hr tablet Take 10 mg by mouth 2 (two) times  daily.    . OXYGEN 2lpm with CPAP at night    . potassium chloride (KLOR-CON) 20 MEQ packet Take 40 mEq by mouth 3 (three) times daily.    Marland Kitchen spironolactone (ALDACTONE) 25 MG tablet Take 25 mg by mouth daily.    Marland Kitchen terazosin (HYTRIN) 5 MG capsule Take 5 mg by mouth 3 (three) times daily.    . traZODone (DESYREL) 50 MG tablet Take 50 mg by mouth at bedtime as needed for sleep.    Marland Kitchen UNABLE TO FIND Med Name: CPAP with o2 2lpm     No current facility-administered medications for this visit.     Allergies  Allergen Reactions  . Dilaudid [Hydromorphone Hcl] Other (See Comments)    Agitation and hallucinations  . Lisinopril Other (See Comments)    Unknown, ask patient  . Morphine Rash    Causes hallucination and agitation      Review of Systems  Weight  stable No recent falls Had some hematuria after urologic procedure this past year which has resolved Uses a rolling walker for stability No respiratory symptoms of productive cough Positive dyspnea on exertion related to his obesity and deconditioning No abdominal pain No dental complaints No difficulty swallowing or symptoms of aspiration Change in vision  BP 116/68 (BP Location: Right Arm, Patient Position: Sitting, Cuff Size: Large)   Pulse 85   Resp 18   Ht 6' (1.829 m)   Wt 261 lb (118.4 kg)   SpO2 95% Comment: ON RA  BMI 35.40 kg/m  Physical Exam      Exam    General- alert and comfortable, 68 year old male accompanied by daughter    Neck- no JVD, no cervical adenopathy palpable, no carotid bruit   Lungs- clear without rales, wheezes   Cor- regular rate and rhythm, no murmur , gallop   Abdomen- soft, non-tender, obese without pulsatile mass   Extremities - warm, non-tender, minimal edema   Neuro- oriented, appropriate, no focal weakness   Diagnostic Tests: CTA images personally reviewed showing stable 4.6 cm fusiform ascending aneurysm.  No significant calcification mural thickening, thrombus or penetrating ulceration. No abnormal pulmonary nodules or mediastinal adenopathy  Impression: Stable asymptomatic moderate fusiform ascending aneurysm.  Best therapy is blood pressure control which was discussed with patient.  Plan: Return for surveillance scan in 1 year.  Risk of dissection in this patient is less than 1 %/year.   Mikey Bussing, MD [facility] [phone]

## 2018-08-19 IMAGING — DX DG CHEST 2V
2 series · 2 of 2 positions shown · non-contrast
Comparison: None.

CLINICAL DATA: Shortness of breath with exertion, worsening
recently, former smoking history, also history of asthma

EXAM:
CHEST - 2 VIEW

[chest pa]
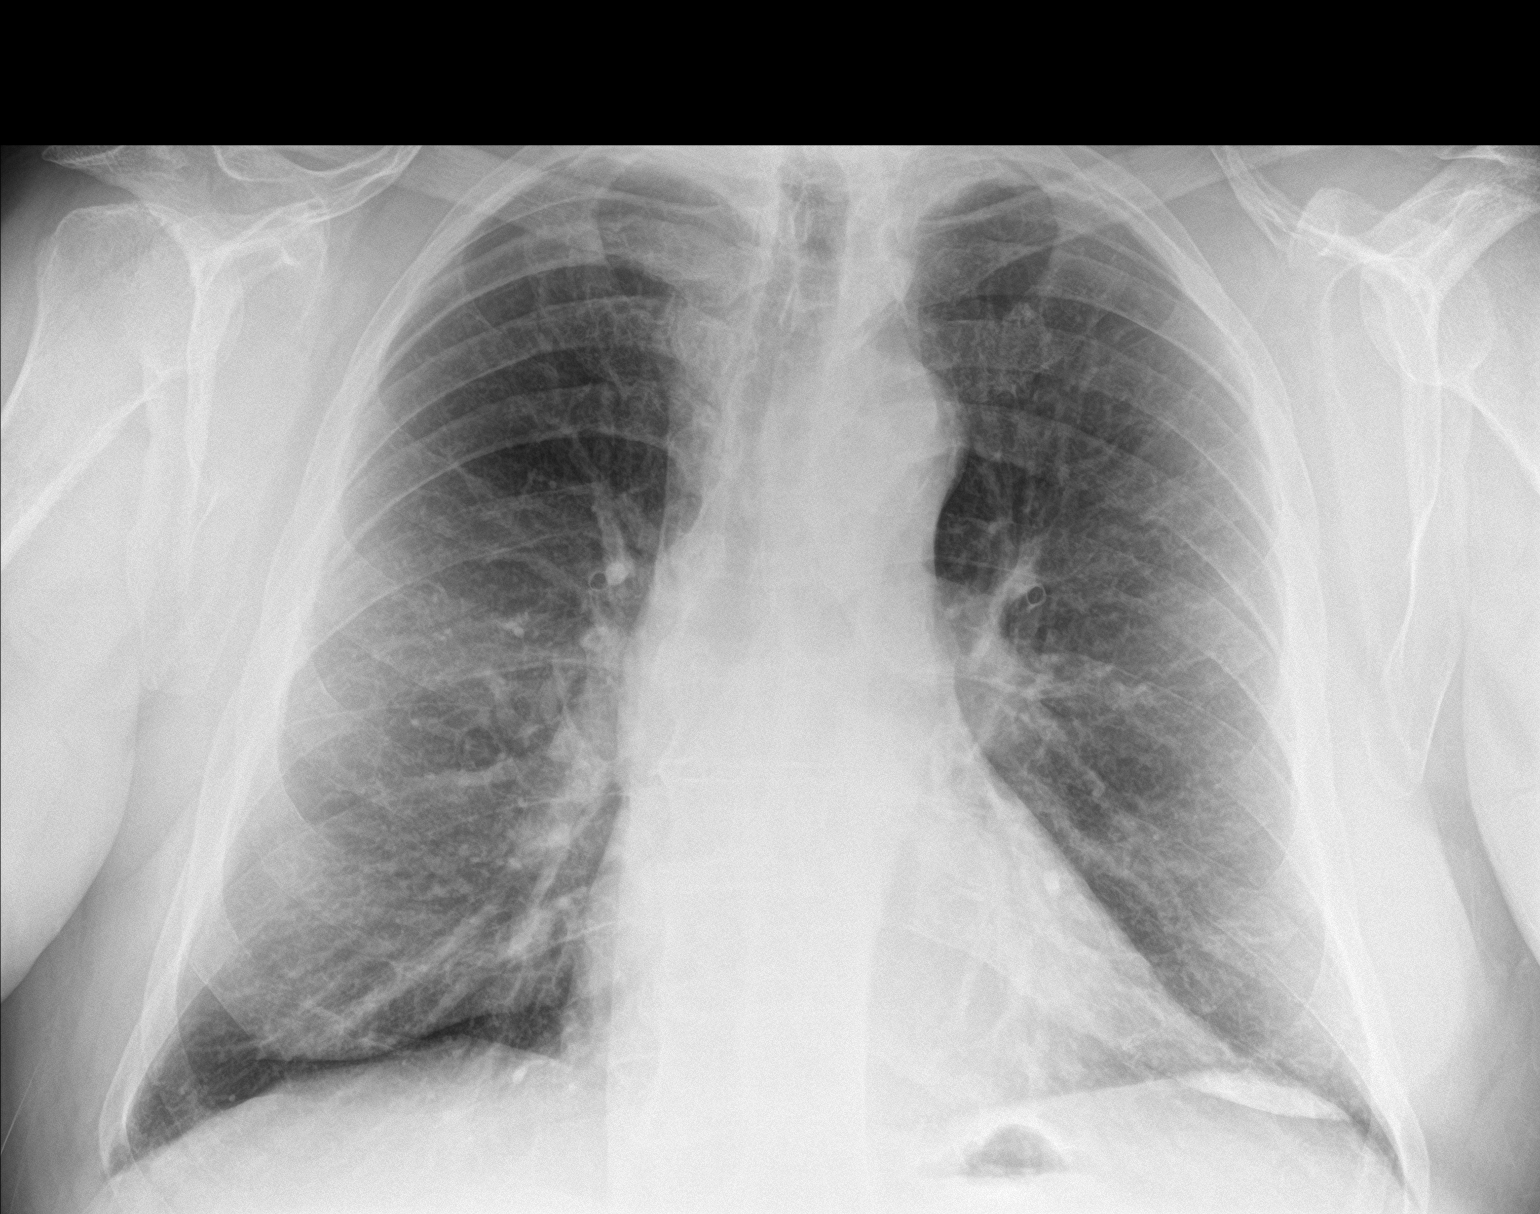

[chest lat]
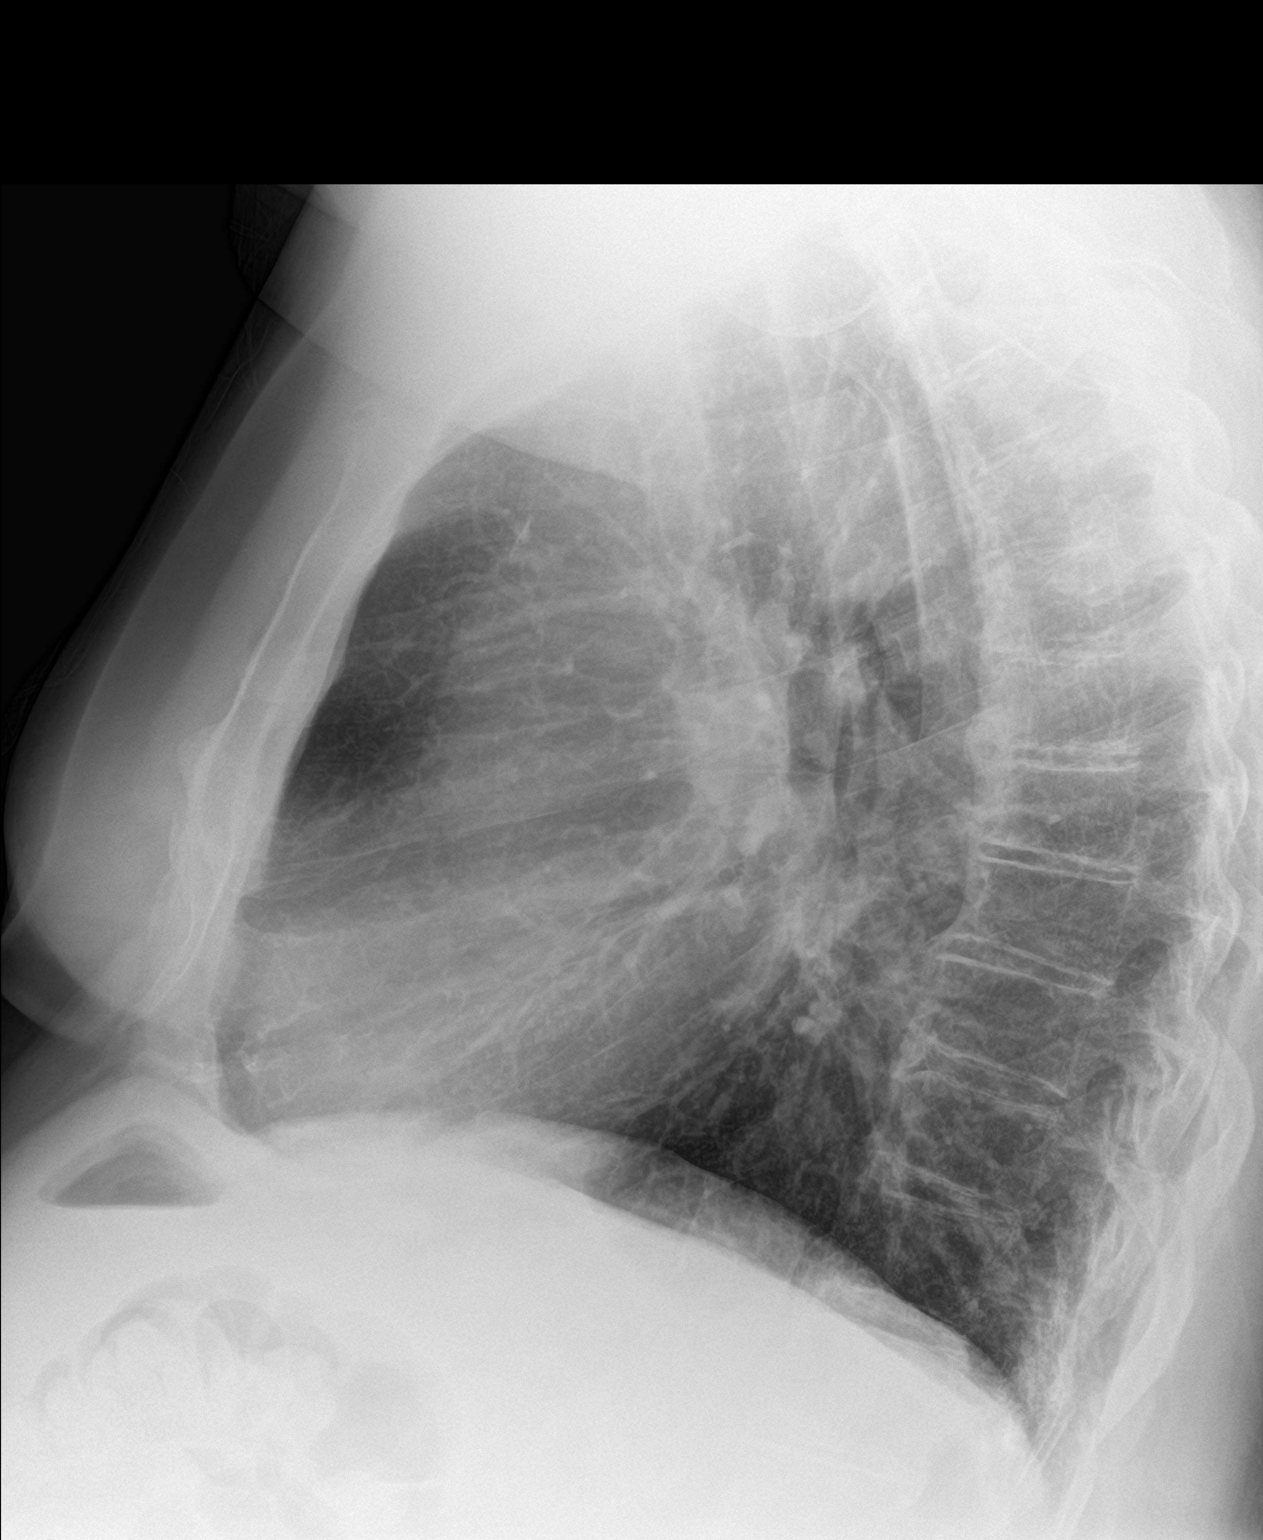

[2 of 2 positions shown; findings below may reference images not displayed]

FINDINGS: No active infiltrate or effusion is seen. The lungs are slightly
hyperaerated. Mediastinal and hilar contours are unremarkable and
the heart is within upper limits of normal. There are degenerative
changes throughout the thoracic spine.
IMPRESSION: 1. No pneumonia or effusion.
2. Slight hyper aeration.  Borderline cardiomegaly.

## 2019-07-07 ENCOUNTER — Other Ambulatory Visit: Payer: Self-pay | Admitting: Surgery

## 2019-07-07 ENCOUNTER — Other Ambulatory Visit: Payer: Self-pay | Admitting: Cardiothoracic Surgery

## 2019-07-07 DIAGNOSIS — I712 Thoracic aortic aneurysm, without rupture, unspecified: Secondary | ICD-10-CM

## 2019-08-24 ENCOUNTER — Ambulatory Visit: Payer: No Typology Code available for payment source | Admitting: Cardiothoracic Surgery

## 2019-08-24 ENCOUNTER — Other Ambulatory Visit: Payer: No Typology Code available for payment source

## 2022-09-24 DEATH — deceased
